# Patient Record
Sex: Male | Born: 2000 | Race: Black or African American | Hispanic: No | Marital: Single | Smoking: Current every day smoker
Health system: Southern US, Community
[De-identification: ages and names within clinical notes are randomized; demographics above are authoritative.]

## PROBLEM LIST (undated history)

## (undated) DIAGNOSIS — F988 Other specified behavioral and emotional disorders with onset usually occurring in childhood and adolescence: Secondary | ICD-10-CM

---
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
## 2018-10-30 ENCOUNTER — Emergency Department (HOSPITAL_COMMUNITY)
Admission: EM | Admit: 2018-10-30 | Discharge: 2018-10-31 | Disposition: A | Discharge status: Home / Self Care | Payer: 59 | Attending: Emergency Medicine | Admitting: Emergency Medicine

## 2018-10-30 ENCOUNTER — Encounter (HOSPITAL_COMMUNITY): Payer: Self-pay | Admitting: *Deleted

## 2018-10-30 ENCOUNTER — Other Ambulatory Visit: Payer: Self-pay

## 2018-10-30 DIAGNOSIS — Z202 Contact with and (suspected) exposure to infections with a predominantly sexual mode of transmission: Secondary | ICD-10-CM | POA: Insufficient documentation

## 2018-10-30 DIAGNOSIS — F909 Attention-deficit hyperactivity disorder, unspecified type: Secondary | ICD-10-CM | POA: Insufficient documentation

## 2018-10-30 DIAGNOSIS — Z79899 Other long term (current) drug therapy: Secondary | ICD-10-CM | POA: Diagnosis not present

## 2018-10-30 LAB — URINALYSIS, ROUTINE W REFLEX MICROSCOPIC
BILIRUBIN URINE: NEGATIVE
Bacteria, UA: NONE SEEN
Glucose, UA: NEGATIVE mg/dL
Hgb urine dipstick: NEGATIVE
Ketones, ur: NEGATIVE mg/dL
Nitrite: NEGATIVE
Protein, ur: NEGATIVE mg/dL
SPECIFIC GRAVITY, URINE: 1.027 (ref 1.005–1.030)
WBC, UA: >50 WBC/hpf — ABNORMAL HIGH (ref 0–5)
pH: 6 (ref 5.0–8.0)

## 2018-10-30 MED ORDER — LIDOCAINE HCL (PF) 1 % IJ SOLN
INTRAMUSCULAR | Status: AC
  Filled 2018-10-30: qty 5

## 2018-10-30 MED ORDER — CEFTRIAXONE SODIUM 250 MG IJ SOLR
250.0000 mg | Freq: Once | INTRAMUSCULAR | Status: AC
  Administered 2018-10-30: 250 mg via INTRAMUSCULAR
  Filled 2018-10-30: qty 250

## 2018-10-30 MED ORDER — AZITHROMYCIN 250 MG PO TABS
1000.0000 mg | ORAL_TABLET | Freq: Once | ORAL | Status: AC
  Administered 2018-10-30: 1000 mg via ORAL
  Filled 2018-10-30: qty 4

## 2018-10-30 NOTE — ED Provider Notes (Addendum)
[facility] EMERGENCY DEPARTMENT Provider Note   CSN: [id number] Arrival date & time: 10/30/18  2226     History   Chief Complaint Chief Complaint  Patient presents with  . Exposure to STD    HPI Jim Myers is a 17 y.o. male presenting for evaluation treatment of STDs.  Patient states he was told by his partner that she was positive for gonorrhea and chlamydia today.  He is here for treatment.  Patient states that last week he was having intermittent dysuria.  This has completely resolved.  He is having no further pain.  He denies penile discharge.  He denies fevers, chills, nausea, vomiting, abdominal pain.  He has no medical problems, takes no medications daily.  He is sexually active with 2 male partners.    HPI  Past Medical History:  Diagnosis Date  . Attention deficit disorder     There are no active problems to display for this patient.   History reviewed. No pertinent surgical history.      Home Medications    Prior to Admission medications   Medication Sig Start Date End Date Taking? Authorizing Provider  azithromycin (ZITHROMAX) 500 MG tablet Take 2 tablets (1,000 mg total) by mouth daily. 07/20/14   Ozella Rocks, MD  Methylphenidate HCl (CONCERTA PO) Take by mouth.    [provider]    Family History No family history on file.  Social History Social History   Tobacco Use  . Smoking status: Never Smoker  . Smokeless tobacco: Never Used  Substance Use Topics  . Alcohol use: No  . Drug use: Yes    Types: Marijuana     Allergies   Patient has no known allergies.   Review of Systems Review of Systems  Constitutional: Negative for fever.  Genitourinary: Positive for dysuria (resolved).     Physical Exam Updated Vital Signs BP 124/79 (BP Location: Right Arm)   Pulse 65   Temp 98.4 F (36.9 C) (Oral)   Resp 14   Ht 6' (1.829 m)   Wt 68 kg   SpO2 100%   BMI 20.34 kg/m   Physical Exam    Constitutional: He is oriented to person, place, and time. He appears well-developed and well-nourished. No distress.  HENT:  Head: Normocephalic and atraumatic.  Eyes: EOM are normal.  Neck: Normal range of motion.  Cardiovascular: Normal rate, regular rhythm and intact distal pulses.  Pulmonary/Chest: Effort normal and breath sounds normal. No respiratory distress. He has no wheezes.  Abdominal: Soft. He exhibits no distension and no mass. There is no tenderness. There is no guarding.  Genitourinary: Testes normal and penis normal. Circumcised. No discharge found.  Genitourinary Comments: Chaperone present.  No inguinal lymphadenopathy.  No penile tenderness.  No penile discharge.  No testicular pain or swelling.  Musculoskeletal: Normal range of motion.  Lymphadenopathy: No inguinal adenopathy noted on the right or left side.  Neurological: He is alert and oriented to person, place, and time.  Skin: Skin is warm. Capillary refill takes less than 2 seconds. No rash noted.  Psychiatric: He has a normal mood and affect.  Nursing note and vitals reviewed.    ED Treatments / Results  Labs (all labs ordered are listed, but only abnormal results are displayed) Labs Reviewed  URINALYSIS, ROUTINE W REFLEX MICROSCOPIC - Abnormal; Notable for the following components:      Result Value   APPearance HAZY (*)    Leukocytes, UA LARGE (*)  WBC, UA >50 (*)    All other components within normal limits  RPR  HIV ANTIBODY (ROUTINE TESTING W REFLEX)  GC/CHLAMYDIA PROBE AMP ([place]) NOT AT [facility]    EKG None  Radiology No results found.  Procedures Procedures (including critical care time)  Medications Ordered in ED Medications  cefTRIAXone (ROCEPHIN) injection 250 mg (has no administration in time range)  azithromycin (ZITHROMAX) tablet 1,000 mg (has no administration in time range)     Initial Impression / Assessment and Plan / ED Course  I have reviewed the triage vital  signs and the nursing notes.  Pertinent labs & imaging results that were available during my care of the patient were reviewed by me and considered in my medical decision making (see chart for details).     Pt presents with concerns for possible STD.  Pt understands that they have GC/Chlamydia cultures pending and that they will need to inform all sexual partners if results return positive. Pt has been treated prophylactically with azithromycin and Rocephin due to pts h/o positive contact.  Patient to be discharged with instructions to follow up with PCP. Discussed importance of using protection when sexually active.  UA with large leuks, no nitrites.  No bacteria.  Patient without urinary symptoms at this time.  Per chart review, patient with similar UAs in the past.  I do not believe he needs antibiotic treatment for this at this time.  Patient to follow-up with his PCP as needed if urinary symptoms persist.  At this time, patient appears safe for discharge.  Return precautions given.  Patient states he understands and agrees plan.   Final Clinical Impressions(s) / ED Diagnoses   Final diagnoses:  Exposure to STD    ED Discharge Orders    None       Alveria Apley, PA-C 10/30/18 2355    Alveria Apley, PA-C 10/30/18 2356    Dione Booze, MD 10/31/18 [phone]

## 2018-10-30 NOTE — ED Triage Notes (Signed)
The pt is here for  A std check  He has had burning when he urinates.  He was just told he had been exposed to his sexual partner that was diagnosed with gc today

## 2018-10-30 NOTE — Discharge Instructions (Addendum)
You were treated for gonorrhea and chlamydia today. The lab test for gonorrhea, chlamydia, HIV, and syphilis are pending.  The results should return in about 3 days.  If the results are positive, you will receive a phone call.  If they are negative, you will not.  You may check either way online on MyChart. If results are positive for gonorrhea or chlamydia, you do not need further treatment. If results are positive for HIV or syphilis, you will need treatment at the health department or with your primary care doctor. Abstain from sex for the next 2 weeks, as this can cause reinfection. Follow-up with your primary care doctor if your urinary symptoms do not improve after treatment. Return to the emergency room with any new, worsening, or concerning symptoms.

## 2018-10-31 LAB — RPR: RPR Ser Ql: NONREACTIVE

## 2018-10-31 LAB — HIV ANTIBODY (ROUTINE TESTING W REFLEX): HIV Screen 4th Generation wRfx: NONREACTIVE

## 2018-11-01 LAB — GC/CHLAMYDIA PROBE AMP (~~LOC~~) NOT AT ARMC
Chlamydia: POSITIVE — AB
Neisseria Gonorrhea: POSITIVE — AB

## 2019-06-18 ENCOUNTER — Emergency Department (HOSPITAL_COMMUNITY)
Admission: EM | Admit: 2019-06-18 | Discharge: 2019-06-19 | Disposition: A | Discharge status: Home / Self Care | Payer: 59 | Attending: Emergency Medicine | Admitting: Emergency Medicine

## 2019-06-18 DIAGNOSIS — Z5321 Procedure and treatment not carried out due to patient leaving prior to being seen by health care provider: Secondary | ICD-10-CM | POA: Insufficient documentation

## 2019-06-18 DIAGNOSIS — R109 Unspecified abdominal pain: Secondary | ICD-10-CM | POA: Insufficient documentation

## 2019-06-18 NOTE — ED Triage Notes (Signed)
Patient here with abdominal pain that started this morning with diarrhea.  He states that he needs a work note also.  Patient states that he is fine, but just needs a work note.

## 2019-06-19 ENCOUNTER — Other Ambulatory Visit: Payer: Self-pay

## 2019-06-19 ENCOUNTER — Encounter (HOSPITAL_COMMUNITY): Payer: Self-pay | Admitting: Emergency Medicine

## 2019-06-19 NOTE — ED Notes (Addendum)
Pt reports he can't wait to be seen and he is leaving. Requested pt to stay and wait to be seen, pt decided to leave.

## 2019-07-31 ENCOUNTER — Encounter (HOSPITAL_COMMUNITY): Payer: Self-pay | Admitting: Emergency Medicine

## 2019-07-31 ENCOUNTER — Emergency Department (HOSPITAL_COMMUNITY)
Admission: EM | Admit: 2019-07-31 | Discharge: 2019-07-31 | Disposition: A | Discharge status: Home / Self Care | Payer: 59 | Attending: Emergency Medicine | Admitting: Emergency Medicine

## 2019-07-31 DIAGNOSIS — Z202 Contact with and (suspected) exposure to infections with a predominantly sexual mode of transmission: Secondary | ICD-10-CM | POA: Diagnosis present

## 2019-07-31 LAB — URINALYSIS, ROUTINE W REFLEX MICROSCOPIC
Bilirubin Urine: NEGATIVE
Glucose, UA: NEGATIVE mg/dL
Hgb urine dipstick: NEGATIVE
Ketones, ur: NEGATIVE mg/dL
Leukocytes,Ua: NEGATIVE
Nitrite: NEGATIVE
Protein, ur: NEGATIVE mg/dL
Specific Gravity, Urine: 1.017 (ref 1.005–1.030)
pH: 7 (ref 5.0–8.0)

## 2019-07-31 MED ORDER — CEFTRIAXONE SODIUM 250 MG IJ SOLR
250.0000 mg | Freq: Once | INTRAMUSCULAR | Status: AC
  Administered 2019-07-31: 250 mg via INTRAMUSCULAR
  Filled 2019-07-31: qty 250

## 2019-07-31 MED ORDER — STERILE WATER FOR INJECTION IJ SOLN
INTRAMUSCULAR | Status: AC
  Administered 2019-07-31: 0.9 mL
  Filled 2019-07-31: qty 10

## 2019-07-31 MED ORDER — AZITHROMYCIN 250 MG PO TABS
1000.0000 mg | ORAL_TABLET | Freq: Once | ORAL | Status: AC
  Administered 2019-07-31: 1000 mg via ORAL
  Filled 2019-07-31: qty 4

## 2019-07-31 NOTE — ED Provider Notes (Signed)
[facility] EMERGENCY DEPARTMENT Provider Note   CSN: [id number] Arrival date & time: 07/31/19  1114   History   Chief Complaint Chief Complaint  Patient presents with  . Exposure to STD    HPI Jim Myers is a 18 y.o. male with past medical history significant for gonorrhea, chlamydia presents for evaluation of STD screening.  Patient states his girlfriend recently tested positive for chlamydia.  He denies fever, chills, nausea, vomiting, abdominal pain, pain with bowel movements, penile discharge, penile rashes or lesions, inguinal swelling, testicular pain or swelling, no joint pain or joint swelling..  Requesting HIV and syphilis testing in addition.  States he is sexually active in a monogamous relationship.  Has had multiple sexual partners.  Intermittently uses protection.  Not followed by PCP.  History obtained from patient and past medical records.  No interpreter was used.     HPI  Past Medical History:  Diagnosis Date  . Attention deficit disorder     There are no active problems to display for this patient.   History reviewed. No pertinent surgical history.      Home Medications    Prior to Admission medications   Medication Sig Start Date End Date Taking? Authorizing Provider  azithromycin (ZITHROMAX) 500 MG tablet Take 2 tablets (1,000 mg total) by mouth daily. 07/20/14   Waldemar Dickens, MD  Methylphenidate HCl (CONCERTA PO) Take by mouth.    [provider]    Family History No family history on file.  Social History Social History   Tobacco Use  . Smoking status: Never Smoker  . Smokeless tobacco: Never Used  Substance Use Topics  . Alcohol use: No  . Drug use: Yes    Types: Marijuana     Allergies   Patient has no known allergies.   Review of Systems Review of Systems  Constitutional: Negative.   HENT: Negative.   Respiratory: Negative.   Cardiovascular: Negative.   Gastrointestinal: Negative.    Genitourinary: Negative.   Musculoskeletal: Negative.   Skin: Negative.   Neurological: Negative.   All other systems reviewed and are negative.    Physical Exam Updated Vital Signs BP 126/79 (BP Location: Right Arm)   Pulse 81   Temp 97.7 F (36.5 C) (Oral)   Resp 20   SpO2 98%   Physical Exam Vitals signs and nursing note reviewed. Exam conducted with a chaperone present.  Constitutional:      General: He is not in acute distress.    Appearance: He is well-developed. He is not ill-appearing, toxic-appearing or diaphoretic.  HENT:     Head: Normocephalic and atraumatic.     Nose: Nose normal.     Mouth/Throat:     Mouth: Mucous membranes are moist.     Pharynx: Oropharynx is clear.  Eyes:     Pupils: Pupils are equal, round, and reactive to light.  Neck:     Musculoskeletal: Normal range of motion and neck supple.  Cardiovascular:     Rate and Rhythm: Normal rate and regular rhythm.  Pulmonary:     Effort: Pulmonary effort is normal. No respiratory distress.  Abdominal:     General: There is no distension.     Palpations: Abdomen is soft.     Hernia: There is no hernia in the left inguinal area or right inguinal area.  Genitourinary:    Penis: Normal.      Scrotum/Testes: Normal. Cremasteric reflex is present.     Epididymis:  Right: Normal.     Left: Normal.     Comments: GU exam performed by PA student Osten with chaperone in room.  Mild thin, clear discharge at urethral meatus.  No testicular tenderness.  No tenderness over epididymis. Musculoskeletal: Normal range of motion.  Lymphadenopathy:     Lower Body: No right inguinal adenopathy. No left inguinal adenopathy.  Skin:    General: Skin is warm and dry.  Neurological:     Mental Status: He is alert.      ED Treatments / Results  Labs (all labs ordered are listed, but only abnormal results are displayed) Labs Reviewed  URINALYSIS, ROUTINE W REFLEX MICROSCOPIC  HIV ANTIBODY (ROUTINE TESTING W  REFLEX)  RPR  GC/CHLAMYDIA PROBE AMP ([place]    EKG None  Radiology No results found.  Procedures Procedures (including critical care time)  Medications Ordered in ED Medications  cefTRIAXone (ROCEPHIN) injection 250 mg (has no administration in time range)  azithromycin (ZITHROMAX) tablet 1,000 mg (has no administration in time range)     Initial Impression / Assessment and Plan / ED Course  I have reviewed the triage vital signs and the nursing notes.  Pertinent labs & imaging results that were available during my care of the patient were reviewed by me and considered in my medical decision making (see chart for details).  18 year old male appears otherwise well presents for evaluation of exposure to STD.  Denies any current symptoms.  Girlfriend tested positive for gonorrhea.   Patient is afebrile without abdominal tenderness, abdominal pain or painful bowel movements to indicate prostatitis.  No tenderness to palpation of the testes or epididymis to suggest orchitis or epididymitis.  STD cultures obtained including HIV, syphilis, gonorrhea and chlamydia. Patient to be discharged with instructions to follow up with PCP. Discussed importance of using protection when sexually active. Pt understands that they have GC/Chlamydia cultures pending and that they will need to inform all sexual partners if results return positive. Patient has been treated prophylactically with azithromycin and Rocephin.       Final Clinical Impressions(s) / ED Diagnoses   Final diagnoses:  STD exposure    ED Discharge Orders    None       Cable Fearn A, PA-C 07/31/19 1204    Sabas SousBero, Michael M, MD 08/04/19 [phone]

## 2019-07-31 NOTE — Discharge Instructions (Signed)
Your STD results will be called to you in 3 days if positive.  If your HIV or syphilis testing is positive you will need to seek reevaluation with the health department.  You are given antibiotics for gonorrhea and chlamydia.  If these results are positive you do not need to seek treatment.  You will however need to notify anyone you have had a recent sexual contact with.  Return to the ED for any new worsening symptoms.
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
## 2019-08-01 LAB — HIV ANTIBODY (ROUTINE TESTING W REFLEX): HIV Screen 4th Generation wRfx: NONREACTIVE

## 2019-08-02 LAB — RPR: RPR Ser Ql: NONREACTIVE

## 2019-11-17 ENCOUNTER — Other Ambulatory Visit: Payer: Self-pay

## 2019-11-17 DIAGNOSIS — Z20822 Contact with and (suspected) exposure to covid-19: Secondary | ICD-10-CM

## 2019-11-20 LAB — NOVEL CORONAVIRUS, NAA: SARS-CoV-2, NAA: NOT DETECTED

## 2019-12-24 ENCOUNTER — Emergency Department (HOSPITAL_COMMUNITY): Payer: 59

## 2019-12-24 ENCOUNTER — Encounter (HOSPITAL_COMMUNITY): Payer: Self-pay | Admitting: Emergency Medicine

## 2019-12-24 ENCOUNTER — Emergency Department (HOSPITAL_COMMUNITY)
Admission: EM | Admit: 2019-12-24 | Discharge: 2019-12-25 | Disposition: A | Discharge status: Home / Self Care | Payer: 59 | Attending: Emergency Medicine | Admitting: Emergency Medicine

## 2019-12-24 ENCOUNTER — Other Ambulatory Visit: Payer: Self-pay

## 2019-12-24 DIAGNOSIS — S79922A Unspecified injury of left thigh, initial encounter: Secondary | ICD-10-CM | POA: Diagnosis not present

## 2019-12-24 DIAGNOSIS — Y92008 Other place in unspecified non-institutional (private) residence as the place of occurrence of the external cause: Secondary | ICD-10-CM | POA: Diagnosis not present

## 2019-12-24 DIAGNOSIS — W3400XA Accidental discharge from unspecified firearms or gun, initial encounter: Secondary | ICD-10-CM | POA: Diagnosis not present

## 2019-12-24 DIAGNOSIS — Z23 Encounter for immunization: Secondary | ICD-10-CM | POA: Diagnosis not present

## 2019-12-24 DIAGNOSIS — Y939 Activity, unspecified: Secondary | ICD-10-CM | POA: Insufficient documentation

## 2019-12-24 DIAGNOSIS — Y999 Unspecified external cause status: Secondary | ICD-10-CM | POA: Diagnosis not present

## 2019-12-24 DIAGNOSIS — F17200 Nicotine dependence, unspecified, uncomplicated: Secondary | ICD-10-CM | POA: Insufficient documentation

## 2019-12-24 LAB — ETHANOL: Alcohol, Ethyl (B): 88 mg/dL — ABNORMAL HIGH (ref <10)

## 2019-12-24 LAB — COMPREHENSIVE METABOLIC PANEL
ALT: 27 U/L (ref 0–44)
AST: 43 U/L — ABNORMAL HIGH (ref 15–41)
Albumin: 4.9 g/dL (ref 3.5–5.0)
Alkaline Phosphatase: 75 U/L (ref 38–126)
Anion gap: 12 (ref 5–15)
BUN: 8 mg/dL (ref 6–20)
CO2: 26 mmol/L (ref 22–32)
Calcium: 9.7 mg/dL (ref 8.9–10.3)
Chloride: 103 mmol/L (ref 98–111)
Creatinine, Ser: 1.19 mg/dL (ref 0.61–1.24)
GFR calc Af Amer: >60 mL/min (ref >60)
GFR calc non Af Amer: >60 mL/min (ref >60)
Glucose, Bld: 126 mg/dL — ABNORMAL HIGH (ref 70–99)
Potassium: 5.1 mmol/L (ref 3.5–5.1)
Sodium: 141 mmol/L (ref 135–145)
Total Bilirubin: 1.2 mg/dL (ref 0.3–1.2)
Total Protein: 7.7 g/dL (ref 6.5–8.1)

## 2019-12-24 LAB — CBC
HCT: 47.3 % (ref 39.0–52.0)
Hemoglobin: 15.8 g/dL (ref 13.0–17.0)
MCH: 31.6 pg (ref 26.0–34.0)
MCHC: 33.4 g/dL (ref 30.0–36.0)
MCV: 94.6 fL (ref 80.0–100.0)
Platelets: 264 10*3/uL (ref 150–400)
RBC: 5 MIL/uL (ref 4.22–5.81)
RDW: 12.2 % (ref 11.5–15.5)
WBC: 6.6 10*3/uL (ref 4.0–10.5)
nRBC: 0 % (ref 0.0–0.2)

## 2019-12-24 LAB — PROTIME-INR
INR: 0.9 (ref 0.8–1.2)
Prothrombin Time: 12.2 seconds (ref 11.4–15.2)

## 2019-12-24 LAB — SAMPLE TO BLOOD BANK

## 2019-12-24 MED ORDER — CEFAZOLIN SODIUM-DEXTROSE 2-4 GM/100ML-% IV SOLN
2.0000 g | Freq: Once | INTRAVENOUS | Status: AC
  Administered 2019-12-24: 2 g via INTRAVENOUS
  Filled 2019-12-24: qty 100

## 2019-12-24 MED ORDER — TETANUS-DIPHTH-ACELL PERTUSSIS 5-2.5-18.5 LF-MCG/0.5 IM SUSP
0.5000 mL | Freq: Once | INTRAMUSCULAR | Status: AC
  Administered 2019-12-24: 0.5 mL via INTRAMUSCULAR
  Filled 2019-12-24: qty 0.5
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
## 2019-12-25 LAB — CDS SEROLOGY

## 2019-12-25 MED ORDER — CEPHALEXIN 500 MG PO CAPS: 500.0000 mg | ORAL_CAPSULE | Freq: Four times a day (QID) | ORAL | 0 refills | Status: DC

## 2019-12-25 MED ORDER — OXYCODONE HCL 5 MG PO TABS
10.0000 mg | ORAL_TABLET | Freq: Once | ORAL | Status: AC
  Administered 2019-12-25: 01:00:00 10 mg via ORAL
  Filled 2019-12-25: qty 2

## 2019-12-25 MED ORDER — OXYCODONE-ACETAMINOPHEN 5-325 MG PO TABS: 1.0000 | ORAL_TABLET | Freq: Once | ORAL | Status: DC
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
## 2019-12-25 NOTE — ED Notes (Signed)
[employer] at bedside 

## 2019-12-26 ENCOUNTER — Encounter (HOSPITAL_COMMUNITY): Payer: Self-pay | Admitting: Emergency Medicine

## 2020-06-04 ENCOUNTER — Other Ambulatory Visit: Payer: Self-pay

## 2020-06-04 ENCOUNTER — Encounter (HOSPITAL_BASED_OUTPATIENT_CLINIC_OR_DEPARTMENT_OTHER): Payer: Self-pay | Admitting: Surgery

## 2020-06-08 ENCOUNTER — Other Ambulatory Visit (HOSPITAL_COMMUNITY)
Admission: RE | Admit: 2020-06-08 | Discharge: 2020-06-08 | Disposition: A | Discharge status: Home / Self Care | Payer: 59 | Source: Ambulatory Visit | Attending: Surgery | Admitting: Surgery

## 2020-06-08 ENCOUNTER — Ambulatory Visit: Payer: Self-pay | Admitting: Surgery

## 2020-06-08 DIAGNOSIS — Z01812 Encounter for preprocedural laboratory examination: Secondary | ICD-10-CM | POA: Diagnosis not present

## 2020-06-08 DIAGNOSIS — Z20822 Contact with and (suspected) exposure to covid-19: Secondary | ICD-10-CM | POA: Diagnosis not present

## 2020-06-08 LAB — SARS CORONAVIRUS 2 (TAT 6-24 HRS): SARS Coronavirus 2: NEGATIVE

## 2020-06-12 ENCOUNTER — Encounter (HOSPITAL_BASED_OUTPATIENT_CLINIC_OR_DEPARTMENT_OTHER): Payer: Self-pay | Admitting: Surgery

## 2020-06-12 ENCOUNTER — Ambulatory Visit (HOSPITAL_BASED_OUTPATIENT_CLINIC_OR_DEPARTMENT_OTHER): Payer: 59 | Admitting: Anesthesiology

## 2020-06-12 ENCOUNTER — Other Ambulatory Visit: Payer: Self-pay

## 2020-06-12 ENCOUNTER — Encounter (HOSPITAL_BASED_OUTPATIENT_CLINIC_OR_DEPARTMENT_OTHER)
Admission: RE | Disposition: A | Discharge status: Home / Self Care | Payer: Self-pay | Source: Home / Self Care | Attending: Surgery

## 2020-06-12 ENCOUNTER — Ambulatory Visit (HOSPITAL_BASED_OUTPATIENT_CLINIC_OR_DEPARTMENT_OTHER)
Admission: RE | Admit: 2020-06-12 | Discharge: 2020-06-12 | Disposition: A | Discharge status: Home / Self Care | Payer: 59 | Attending: Surgery | Admitting: Surgery

## 2020-06-12 DIAGNOSIS — F17210 Nicotine dependence, cigarettes, uncomplicated: Secondary | ICD-10-CM | POA: Diagnosis not present

## 2020-06-12 DIAGNOSIS — S71142A Puncture wound with foreign body, left thigh, initial encounter: Secondary | ICD-10-CM | POA: Diagnosis present

## 2020-06-12 DIAGNOSIS — W3400XA Accidental discharge from unspecified firearms or gun, initial encounter: Secondary | ICD-10-CM | POA: Diagnosis not present

## 2020-06-12 HISTORY — PX: FOREIGN BODY REMOVAL: SHX962

## 2020-06-12 SURGERY — FOREIGN BODY REMOVAL ADULT
Anesthesia: General | Site: Thigh | Laterality: Left

## 2020-06-12 MED ORDER — MEPERIDINE HCL 25 MG/ML IJ SOLN: 6.2500 mg | INTRAMUSCULAR | Status: DC | PRN

## 2020-06-12 MED ORDER — OXYCODONE HCL 5 MG/5ML PO SOLN: 5.0000 mg | Freq: Once | ORAL | Status: AC | PRN

## 2020-06-12 MED ORDER — MIDAZOLAM HCL 2 MG/2ML IJ SOLN
INTRAMUSCULAR | Status: AC
  Filled 2020-06-12: qty 2

## 2020-06-12 MED ORDER — LIDOCAINE 2% (20 MG/ML) 5 ML SYRINGE
INTRAMUSCULAR | Status: AC
  Filled 2020-06-12: qty 5

## 2020-06-12 MED ORDER — BUPIVACAINE HCL (PF) 0.5 % IJ SOLN
INTRAMUSCULAR | Status: DC | PRN
  Administered 2020-06-12: 10 mL

## 2020-06-12 MED ORDER — FENTANYL CITRATE (PF) 100 MCG/2ML IJ SOLN
INTRAMUSCULAR | Status: AC
  Filled 2020-06-12: qty 2

## 2020-06-12 MED ORDER — IBUPROFEN 800 MG PO TABS
800.0000 mg | ORAL_TABLET | Freq: Three times a day (TID) | ORAL | 1 refills | Status: AC
Start: 2020-06-12 — End: ?

## 2020-06-12 MED ORDER — ONDANSETRON HCL 4 MG/2ML IJ SOLN
INTRAMUSCULAR | Status: DC | PRN
  Administered 2020-06-12: 4 mg via INTRAVENOUS

## 2020-06-12 MED ORDER — LACTATED RINGERS IV SOLN: INTRAVENOUS | Status: DC

## 2020-06-12 MED ORDER — PROPOFOL 10 MG/ML IV BOLUS
INTRAVENOUS | Status: DC | PRN
  Administered 2020-06-12: 200 mg via INTRAVENOUS

## 2020-06-12 MED ORDER — MIDAZOLAM HCL 5 MG/5ML IJ SOLN
INTRAMUSCULAR | Status: DC | PRN
  Administered 2020-06-12: 2 mg via INTRAVENOUS

## 2020-06-12 MED ORDER — METHOCARBAMOL 500 MG PO TABS: 500.0000 mg | ORAL_TABLET | Freq: Four times a day (QID) | ORAL | 1 refills | Status: AC

## 2020-06-12 MED ORDER — DEXAMETHASONE SODIUM PHOSPHATE 4 MG/ML IJ SOLN
INTRAMUSCULAR | Status: DC | PRN
  Administered 2020-06-12: 5 mg via INTRAVENOUS

## 2020-06-12 MED ORDER — OXYCODONE HCL 5 MG PO TABS
5.0000 mg | ORAL_TABLET | Freq: Once | ORAL | Status: AC | PRN
  Administered 2020-06-12: 5 mg via ORAL

## 2020-06-12 MED ORDER — PROMETHAZINE HCL 25 MG/ML IJ SOLN: 6.2500 mg | INTRAMUSCULAR | Status: DC | PRN

## 2020-06-12 MED ORDER — BUPIVACAINE LIPOSOME 1.3 % IJ SUSP: 20.0000 mL | Freq: Once | INTRAMUSCULAR | Status: DC

## 2020-06-12 MED ORDER — CHLORHEXIDINE GLUCONATE CLOTH 2 % EX PADS: 6.0000 | MEDICATED_PAD | Freq: Once | CUTANEOUS | Status: DC

## 2020-06-12 MED ORDER — LIDOCAINE 2% (20 MG/ML) 5 ML SYRINGE
INTRAMUSCULAR | Status: DC | PRN
  Administered 2020-06-12: 60 mg via INTRAVENOUS

## 2020-06-12 MED ORDER — BUPIVACAINE HCL (PF) 0.5 % IJ SOLN
INTRAMUSCULAR | Status: AC
  Filled 2020-06-12: qty 30

## 2020-06-12 MED ORDER — LIDOCAINE HCL (PF) 1 % IJ SOLN
INTRAMUSCULAR | Status: AC
  Filled 2020-06-12: qty 30

## 2020-06-12 MED ORDER — PROPOFOL 10 MG/ML IV BOLUS
INTRAVENOUS | Status: AC
  Filled 2020-06-12: qty 20

## 2020-06-12 MED ORDER — CEFAZOLIN SODIUM-DEXTROSE 2-4 GM/100ML-% IV SOLN
2.0000 g | INTRAVENOUS | Status: AC
  Administered 2020-06-12: 2 g via INTRAVENOUS

## 2020-06-12 MED ORDER — HYDROMORPHONE HCL 1 MG/ML IJ SOLN: 0.2500 mg | INTRAMUSCULAR | Status: DC | PRN

## 2020-06-12 MED ORDER — FENTANYL CITRATE (PF) 100 MCG/2ML IJ SOLN
INTRAMUSCULAR | Status: DC | PRN
  Administered 2020-06-12: 100 ug via INTRAVENOUS

## 2020-06-12 MED ORDER — 0.9 % SODIUM CHLORIDE (POUR BTL) OPTIME
TOPICAL | Status: DC | PRN
  Administered 2020-06-12: 1000 mL

## 2020-06-12 MED ORDER — OXYCODONE HCL 5 MG PO TABS
ORAL_TABLET | ORAL | Status: AC
  Filled 2020-06-12: qty 1

## 2020-06-12 MED ORDER — CEFAZOLIN SODIUM-DEXTROSE 2-4 GM/100ML-% IV SOLN
INTRAVENOUS | Status: AC
  Filled 2020-06-12: qty 100

## 2020-06-12 SURGICAL SUPPLY — 63 items
BENZOIN TINCTURE PRP APPL 2/3 (GAUZE/BANDAGES/DRESSINGS) ×3 IMPLANT
BLADE CLIPPER SURG (BLADE) IMPLANT
BLADE SURG 10 STRL SS (BLADE) ×3 IMPLANT
BLADE SURG 15 STRL LF DISP TIS (BLADE) ×1 IMPLANT
BLADE SURG 15 STRL SS (BLADE) ×2
CANISTER SUCT 1200ML W/VALVE (MISCELLANEOUS) ×3 IMPLANT
CHLORAPREP W/TINT 26 (MISCELLANEOUS) ×3 IMPLANT
CLOSURE STERI-STRIP 1/2X4 (GAUZE/BANDAGES/DRESSINGS) ×1
CLOSURE WOUND 1/4X4 (GAUZE/BANDAGES/DRESSINGS) ×1
CLSR STERI-STRIP ANTIMIC 1/2X4 (GAUZE/BANDAGES/DRESSINGS) ×2 IMPLANT
COVER BACK TABLE 60X90IN (DRAPES) ×3 IMPLANT
COVER MAYO STAND STRL (DRAPES) ×6 IMPLANT
COVER PROBE 5X48 (MISCELLANEOUS) ×2
COVER WAND RF STERILE (DRAPES) IMPLANT
DECANTER SPIKE VIAL GLASS SM (MISCELLANEOUS) IMPLANT
DERMABOND ADVANCED (GAUZE/BANDAGES/DRESSINGS) ×2
DERMABOND ADVANCED .7 DNX12 (GAUZE/BANDAGES/DRESSINGS) ×1 IMPLANT
DRAPE LAPAROTOMY 100X72 PEDS (DRAPES) ×3 IMPLANT
DRAPE UTILITY XL STRL (DRAPES) ×3 IMPLANT
DRSG TEGADERM 4X10 (GAUZE/BANDAGES/DRESSINGS) ×3 IMPLANT
DRSG TEGADERM 4X4.75 (GAUZE/BANDAGES/DRESSINGS) IMPLANT
DRSG TELFA 3X8 NADH (GAUZE/BANDAGES/DRESSINGS) ×3 IMPLANT
ELECT COATED BLADE 2.86 ST (ELECTRODE) IMPLANT
ELECT REM PT RETURN 9FT ADLT (ELECTROSURGICAL) ×3
ELECTRODE REM PT RTRN 9FT ADLT (ELECTROSURGICAL) ×1 IMPLANT
GAUZE PACKING IODOFORM 1/4X15 (PACKING) IMPLANT
GAUZE SPONGE 4X4 12PLY STRL LF (GAUZE/BANDAGES/DRESSINGS) IMPLANT
GLOVE BIO SURGEON STRL SZ 6.5 (GLOVE) ×2 IMPLANT
GLOVE BIO SURGEONS STRL SZ 6.5 (GLOVE) ×1
GLOVE BIOGEL M 6.5 STRL (GLOVE) ×3 IMPLANT
GLOVE BIOGEL M 7.0 STRL (GLOVE) ×3 IMPLANT
GLOVE BIOGEL PI IND STRL 6 (GLOVE) ×1 IMPLANT
GLOVE BIOGEL PI IND STRL 7.0 (GLOVE) ×2 IMPLANT
GLOVE BIOGEL PI INDICATOR 6 (GLOVE) ×2
GLOVE BIOGEL PI INDICATOR 7.0 (GLOVE) ×4
GOWN STRL REUS W/ TWL LRG LVL3 (GOWN DISPOSABLE) ×2 IMPLANT
GOWN STRL REUS W/TWL LRG LVL3 (GOWN DISPOSABLE) ×4
KIT CVR 48X5XPRB PLUP LF (MISCELLANEOUS) ×1 IMPLANT
KIT MARKER MARGIN INK (KITS) IMPLANT
NEEDLE HYPO 25X1 1.5 SAFETY (NEEDLE) ×3 IMPLANT
NS IRRIG 1000ML POUR BTL (IV SOLUTION) IMPLANT
PENCIL SMOKE EVACUATOR (MISCELLANEOUS) ×3 IMPLANT
SET BASIN DAY SURGERY F.S. (CUSTOM PROCEDURE TRAY) ×3 IMPLANT
SLEEVE SCD COMPRESS KNEE MED (MISCELLANEOUS) ×3 IMPLANT
SPONGE LAP 18X18 RF (DISPOSABLE) ×3 IMPLANT
STRIP CLOSURE SKIN 1/4X4 (GAUZE/BANDAGES/DRESSINGS) ×2 IMPLANT
SUT ETHILON 2 0 FS 18 (SUTURE) IMPLANT
SUT MNCRL AB 4-0 PS2 18 (SUTURE) ×3 IMPLANT
SUT SILK 2 0 SH (SUTURE) IMPLANT
SUT VIC AB 2-0 SH 27 (SUTURE)
SUT VIC AB 2-0 SH 27XBRD (SUTURE) IMPLANT
SUT VIC AB 3-0 SH 27 (SUTURE) ×2
SUT VIC AB 3-0 SH 27X BRD (SUTURE) ×1 IMPLANT
SUT VICRYL 3-0 CR8 SH (SUTURE) IMPLANT
SWAB COLLECTION DEVICE MRSA (MISCELLANEOUS) IMPLANT
SWAB CULTURE ESWAB REG 1ML (MISCELLANEOUS) IMPLANT
SYR BULB IRRIG 60ML STRL (SYRINGE) ×3 IMPLANT
SYR CONTROL 10ML LL (SYRINGE) ×3 IMPLANT
TOWEL GREEN STERILE FF (TOWEL DISPOSABLE) ×3 IMPLANT
TUBE CONNECTING 20'X1/4 (TUBING) ×1
TUBE CONNECTING 20X1/4 (TUBING) ×2 IMPLANT
UNDERPAD 30X36 HEAVY ABSORB (UNDERPADS AND DIAPERS) ×3 IMPLANT
YANKAUER SUCT BULB TIP NO VENT (SUCTIONS) ×3 IMPLANT
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
## 2020-06-12 NOTE — Anesthesia Preprocedure Evaluation (Signed)
Anesthesia Evaluation  Patient identified by MRN, date of birth, ID band Patient awake    Reviewed: Allergy & Precautions, NPO status , Patient's Chart, lab work & pertinent test results  Airway Mallampati: II  TM Distance: >3 FB Neck ROM: Full    Dental no notable dental hx.    Pulmonary neg pulmonary ROS, Current Smoker and Patient abstained from smoking.,    Pulmonary exam normal breath sounds clear to auscultation       Cardiovascular negative cardio ROS Normal cardiovascular exam Rhythm:Regular Rate:Normal     Neuro/Psych negative neurological ROS  negative psych ROS   GI/Hepatic negative GI ROS, Neg liver ROS,   Endo/Other  negative endocrine ROS  Renal/GU negative Renal ROS  negative genitourinary   Musculoskeletal negative musculoskeletal ROS (+)   Abdominal   Peds negative pediatric ROS (+)  Hematology negative hematology ROS (+)   Anesthesia Other Findings   Reproductive/Obstetrics negative OB ROS                             Anesthesia Physical Anesthesia Plan  ASA: II  Anesthesia Plan: General   Post-op Pain Management:    Induction: Intravenous  PONV Risk Score and Plan: 1 and Ondansetron and Treatment may vary due to age or medical condition  Airway Management Planned: LMA  Additional Equipment:   Intra-op Plan:   Post-operative Plan: Extubation in OR  Informed Consent: I have reviewed the patients History and Physical, chart, labs and discussed the procedure including the risks, benefits and alternatives for the proposed anesthesia with the patient or authorized representative who has indicated his/her understanding and acceptance.     Dental advisory given  Plan Discussed with: CRNA  Anesthesia Plan Comments:         Anesthesia Quick Evaluation

## 2020-06-12 NOTE — Anesthesia Procedure Notes (Signed)
Procedure Name: LMA Insertion Date/Time: 06/12/2020 10:55 AM Performed by: Burna Cash, CRNA Pre-anesthesia Checklist: Patient identified, Emergency Drugs available, Suction available and Patient being monitored Patient Re-evaluated:Patient Re-evaluated prior to induction Oxygen Delivery Method: Circle system utilized Preoxygenation: Pre-oxygenation with 100% oxygen Induction Type: IV induction Ventilation: Mask ventilation without difficulty LMA: LMA inserted LMA Size: 5.0 Number of attempts: 1 Airway Equipment and Method: Bite block Placement Confirmation: positive ETCO2 Tube secured with: Tape Dental Injury: Teeth and Oropharynx as per pre-operative assessment
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
## 2020-06-12 NOTE — Discharge Instructions (Addendum)
May shower beginning 06/13/2020. May remove clear dressing and white square underneath on 06/14/2020. Do not peel off strips underneath, allow them to fall off on their own. May allow warm soapy water to run over incision, then rinse and pat dry. Do not soak in any water (tubs, hot tubs, pools, lakes, oceans) for one week.   Call the office at [phone] for temperature greater than 101.28F, worsening pain, redness or warmth at the incision site.  Please call [phone] to make an appointment for 1-2 weeks after surgery for wound check.    Post Anesthesia Home Care Instructions  Activity: Get plenty of rest for the remainder of the day. A responsible individual must stay with you for 24 hours following the procedure.  For the next 24 hours, DO NOT: -Drive a car -Advertising copywriter -Drink alcoholic beverages -Take any medication unless instructed by your physician -Make any legal decisions or sign important papers.  Meals: Start with liquid foods such as gelatin or soup. Progress to regular foods as tolerated. Avoid greasy, spicy, heavy foods. If nausea and/or vomiting occur, drink only clear liquids until the nausea and/or vomiting subsides. Call your physician if vomiting continues.  Special Instructions/Symptoms: Your throat may feel dry or sore from the anesthesia or the breathing tube placed in your throat during surgery. If this causes discomfort, gargle with warm salt water. The discomfort should disappear within 24 hours.  If you had a scopolamine patch placed behind your ear for the management of post- operative nausea and/or vomiting:  1. The medication in the patch is effective for 72 hours, after which it should be removed.  Wrap patch in a tissue and discard in the trash. Wash hands thoroughly with soap and water. 2. You may remove the patch earlier than 72 hours if you experience unpleasant side effects which may include dry mouth, dizziness or visual disturbances. 3. Avoid  touching the patch. Wash your hands with soap and water after contact with the patch.
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
## 2020-06-13 ENCOUNTER — Encounter (HOSPITAL_BASED_OUTPATIENT_CLINIC_OR_DEPARTMENT_OTHER): Payer: Self-pay | Admitting: Surgery

## 2021-01-27 ENCOUNTER — Other Ambulatory Visit: Payer: Self-pay

## 2021-01-27 ENCOUNTER — Emergency Department (HOSPITAL_COMMUNITY): Payer: 59

## 2021-01-27 ENCOUNTER — Emergency Department (HOSPITAL_COMMUNITY)
Admission: EM | Admit: 2021-01-27 | Discharge: 2021-01-27 | Disposition: A | Discharge status: Home / Self Care | Payer: 59 | Attending: Emergency Medicine | Admitting: Emergency Medicine

## 2021-01-27 ENCOUNTER — Encounter (HOSPITAL_COMMUNITY): Payer: Self-pay

## 2021-01-27 DIAGNOSIS — S6010XA Contusion of unspecified finger with damage to nail, initial encounter: Secondary | ICD-10-CM

## 2021-01-27 DIAGNOSIS — S62639A Displaced fracture of distal phalanx of unspecified finger, initial encounter for closed fracture: Secondary | ICD-10-CM

## 2021-01-27 DIAGNOSIS — S6991XA Unspecified injury of right wrist, hand and finger(s), initial encounter: Secondary | ICD-10-CM | POA: Diagnosis present

## 2021-01-27 DIAGNOSIS — F17210 Nicotine dependence, cigarettes, uncomplicated: Secondary | ICD-10-CM | POA: Insufficient documentation

## 2021-01-27 DIAGNOSIS — S60141A Contusion of right ring finger with damage to nail, initial encounter: Secondary | ICD-10-CM | POA: Diagnosis not present

## 2021-01-27 DIAGNOSIS — S62634A Displaced fracture of distal phalanx of right ring finger, initial encounter for closed fracture: Secondary | ICD-10-CM | POA: Insufficient documentation

## 2021-01-27 DIAGNOSIS — W230XXA Caught, crushed, jammed, or pinched between moving objects, initial encounter: Secondary | ICD-10-CM | POA: Insufficient documentation

## 2021-01-27 MED ORDER — ACETAMINOPHEN 325 MG PO TABS
650.0000 mg | ORAL_TABLET | Freq: Once | ORAL | Status: AC
  Administered 2021-01-27: 650 mg via ORAL
  Filled 2021-01-27: qty 2

## 2021-01-27 NOTE — ED Triage Notes (Addendum)
Pt reports he is here today due to right ring finger. Pt reports yesterday evening he smashed his finger in a door and now it is swollen and hurting.

## 2021-01-27 NOTE — ED Notes (Signed)
Ortho tech at bedside 
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
## 2021-01-27 NOTE — Progress Notes (Signed)
Orthopedic Tech Progress Note Patient Details:  Jim Myers 15-Apr-2001 [id number]  Ortho Devices Type of Ortho Device: Finger splint Ortho Device/Splint Location: RUE Ortho Device/Splint Interventions: Ordered,Application,Adjustment   Post Interventions Patient Tolerated: Well Instructions Provided: Care of device,Poper ambulation with device   [name] 01/27/2021, 10:25 AM

## 2021-07-14 IMAGING — CR DG HAND COMPLETE 3+V*R*
3 series · 3 of 3 positions shown · non-contrast
Comparison: X-ray right hand 09/14/14

CLINICAL DATA: slammed hand in door yesterday, pain + swelling
right hand

EXAM:
RIGHT HAND - COMPLETE 3+ VIEW

[hand pa]
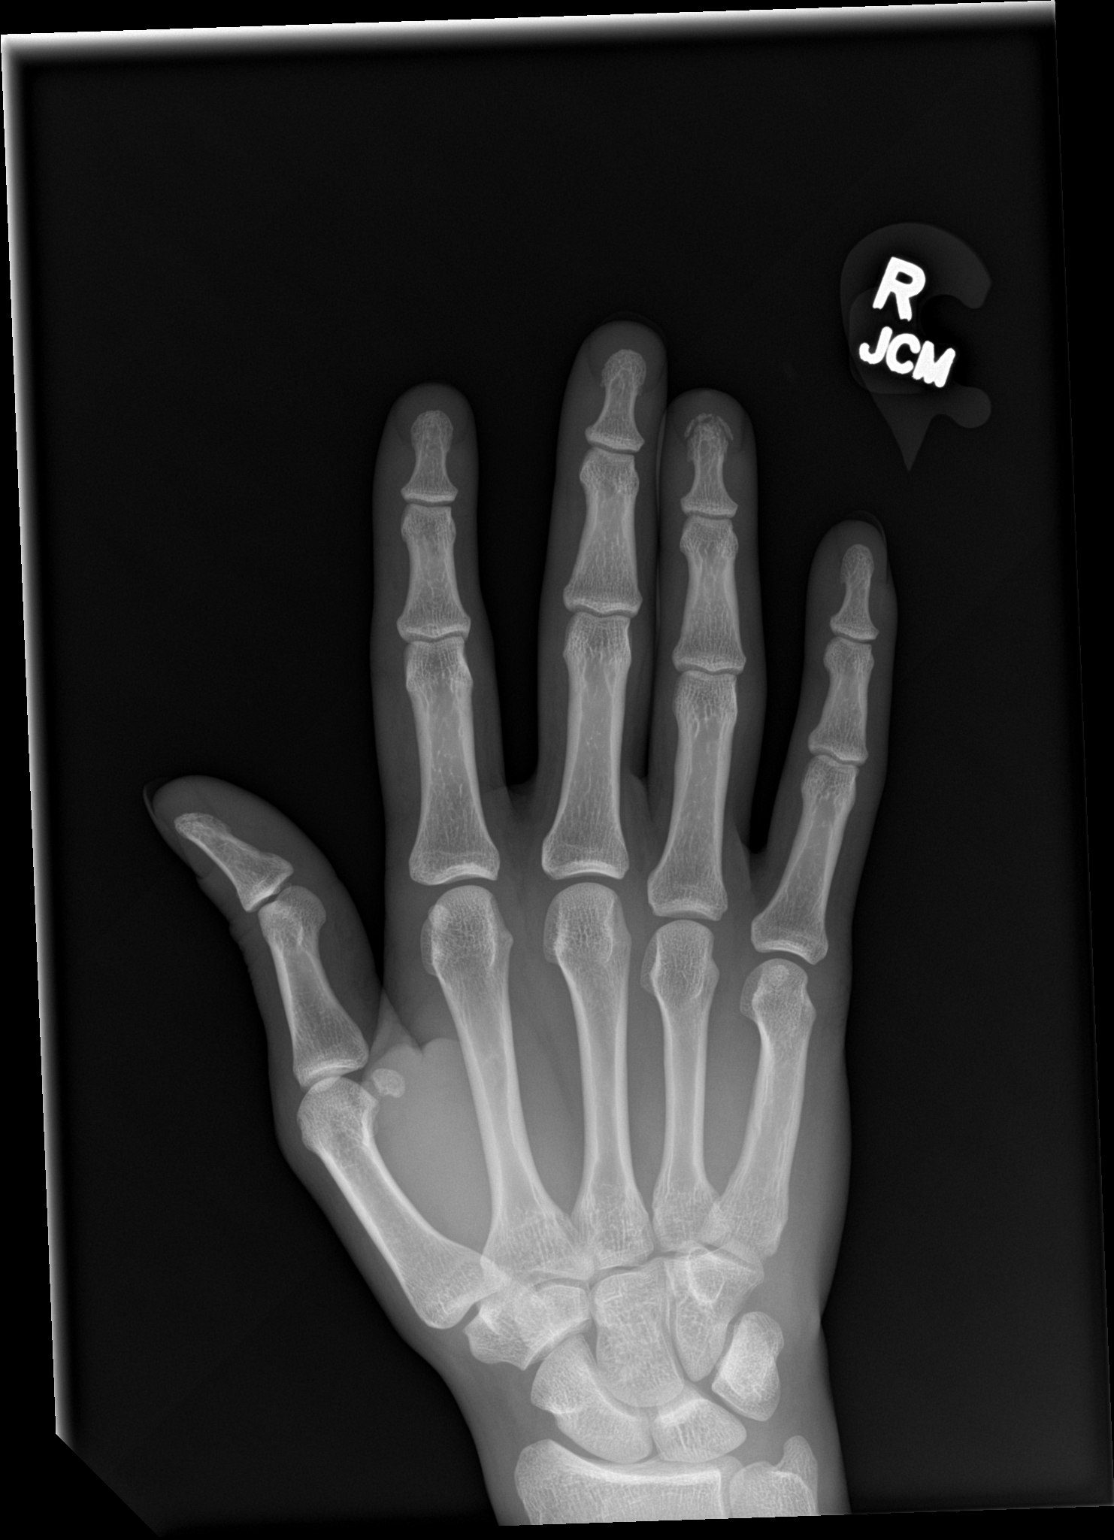

[hand obl]
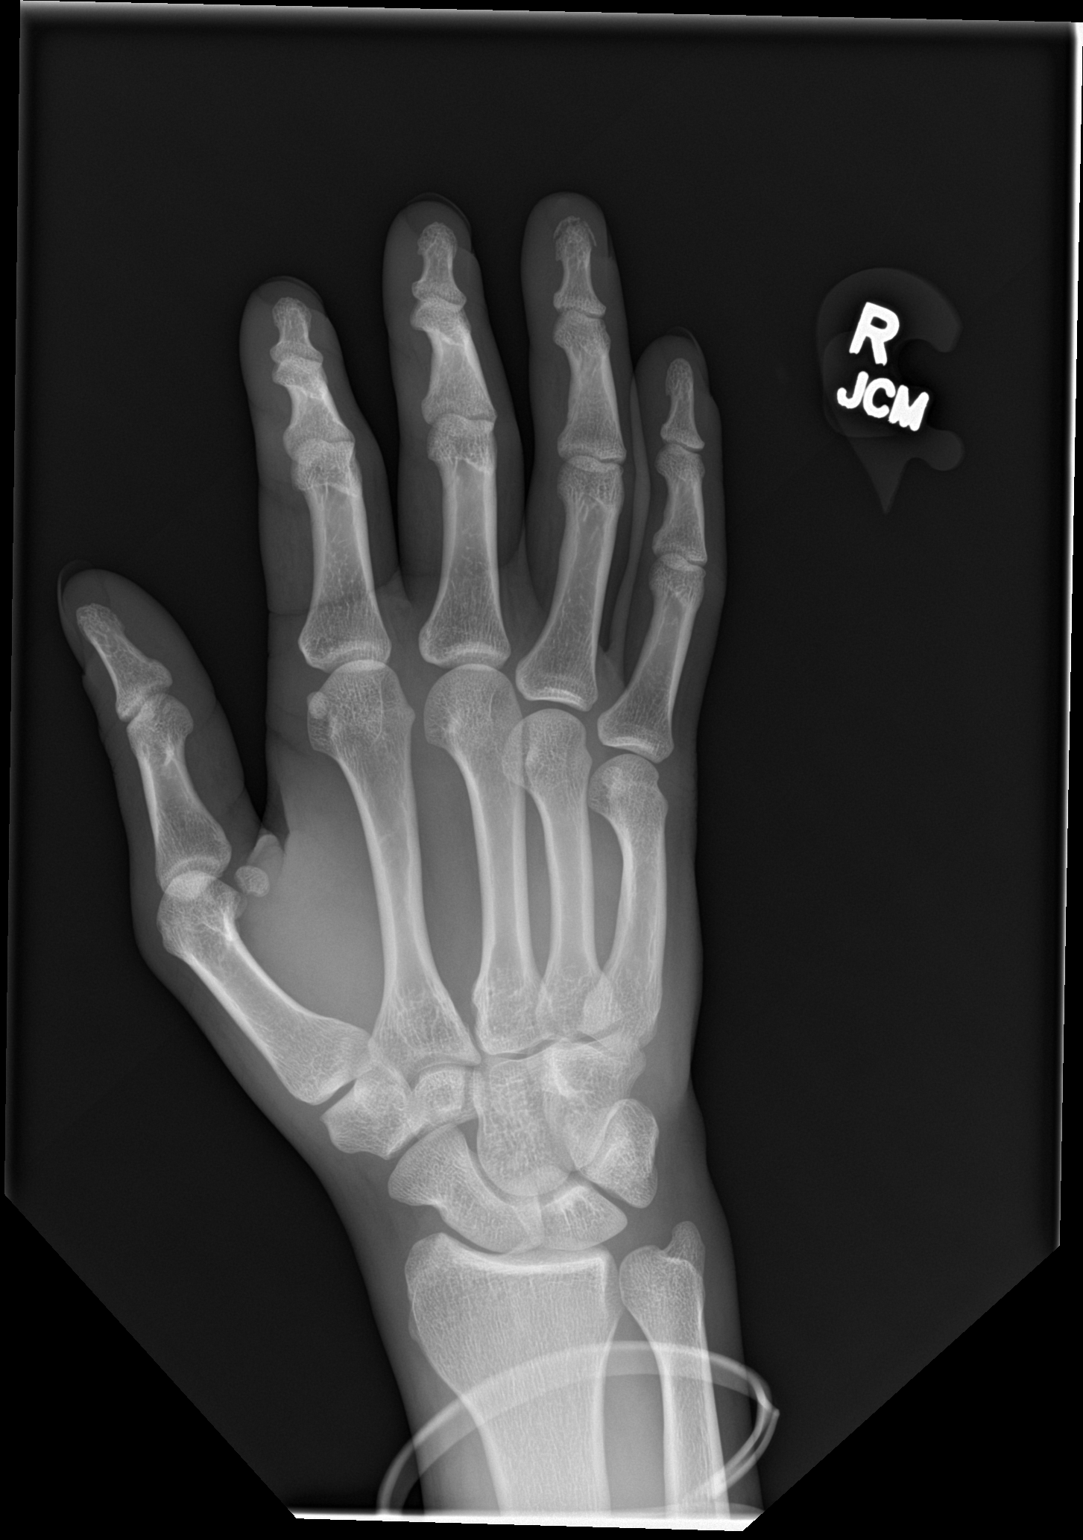

[hand lat]
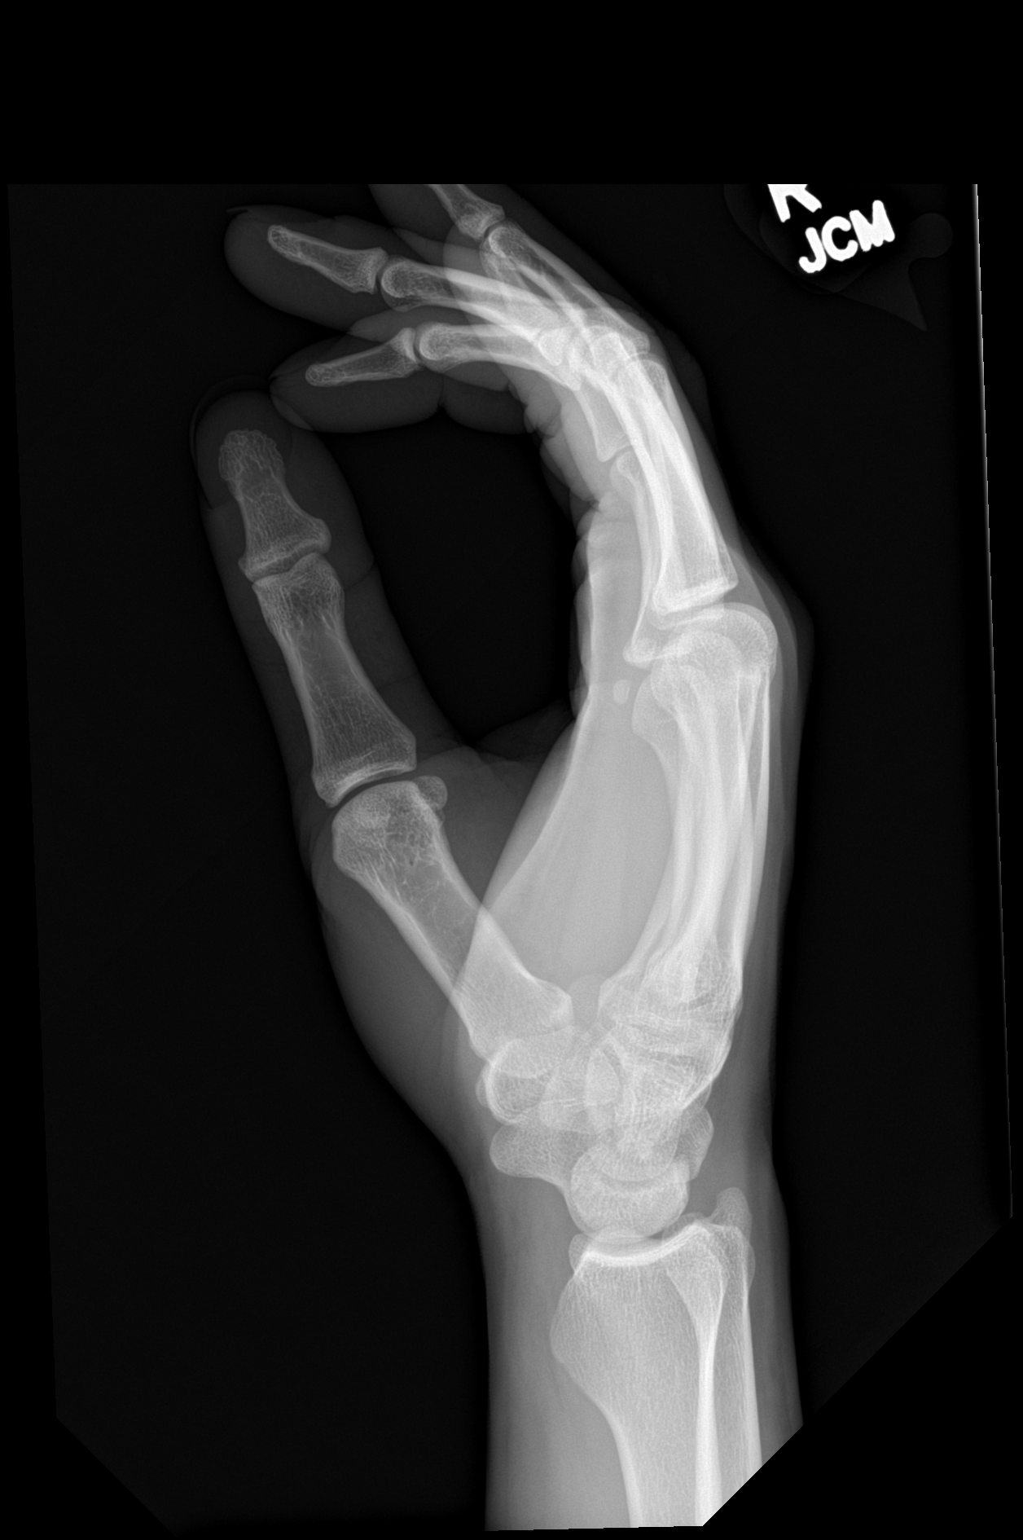

[3 of 3 positions shown; findings below may reference images not displayed]

FINDINGS: Comminuted minimally displaced fracture of the fourth distal phalanx
tuft. There is no evidence of arthropathy or other focal bone
abnormality. Associated subcutaneus soft tissue edema. No retained
radiopaque foreign body.
IMPRESSION: Comminuted minimally displaced fracture of the fourth distal phalanx
tuft.

## 2021-08-15 ENCOUNTER — Ambulatory Visit (HOSPITAL_COMMUNITY)
Admission: EM | Admit: 2021-08-15 | Discharge: 2021-08-15 | Disposition: A | Discharge status: Home / Self Care | Payer: 59 | Attending: Student | Admitting: Student

## 2021-08-15 ENCOUNTER — Other Ambulatory Visit: Payer: Self-pay

## 2021-08-15 ENCOUNTER — Encounter (HOSPITAL_COMMUNITY): Payer: Self-pay | Admitting: Emergency Medicine

## 2021-08-15 DIAGNOSIS — Z202 Contact with and (suspected) exposure to infections with a predominantly sexual mode of transmission: Secondary | ICD-10-CM

## 2021-08-15 DIAGNOSIS — Z113 Encounter for screening for infections with a predominantly sexual mode of transmission: Secondary | ICD-10-CM | POA: Diagnosis not present

## 2021-08-15 MED ORDER — METRONIDAZOLE 500 MG PO TABS
500.0000 mg | ORAL_TABLET | Freq: Two times a day (BID) | ORAL | 0 refills | Status: AC
Start: 2021-08-15 — End: ?

## 2021-08-15 NOTE — ED Triage Notes (Signed)
Patient denies symptoms.  Partner has been exposed to [employer]

## 2021-08-15 NOTE — Discharge Instructions (Addendum)
-  For trichomonas, start the antibiotic-Flagyl (metronidazole), 2 pills daily for 7 days.  You can take this with food if you have a sensitive stomach.  Avoid alcohol while taking this medication and for 2 days after as this will cause severe nausea and vomiting. -We're also screening for gonorrhea and chlamydia. We'll call in about 2 days if this is positive.  -Abstain from intercourse until you complete treatment.

## 2021-08-15 NOTE — ED Provider Notes (Signed)
[facility]    CSN: [id number] Arrival date & time: 08/15/21  0831      History   Chief Complaint Chief Complaint  Patient presents with   Exposure to STD    HPI Hadyn Blanck is a 20 y.o. male presenting with STI screen- exposure to trichomonas (male partner).  Medical history noncontributory. Denies absolutely any symptoms. Denies hematuria, dysuria, frequency, urgency, back pain, n/v/d/abd pain, fevers/chills, abdnormal penile discharge, penile rashes/lesions, penile/testicular swelling/pain.   HPI  Past Medical History:  Diagnosis Date   Attention deficit disorder     There are no problems to display for this patient.   Past Surgical History:  Procedure Laterality Date   FOREIGN BODY REMOVAL Left 06/12/2020   Procedure: REMOVAL OF FOREIGN BODY LEFT THIGH;  Surgeon: Diamantina Monks, MD;  Location: [facility];  Service: General;  Laterality: Left;       Home Medications    Prior to Admission medications   Medication Sig Start Date End Date Taking? Authorizing Provider  metroNIDAZOLE (FLAGYL) 500 MG tablet Take 1 tablet (500 mg total) by mouth 2 (two) times daily. 08/15/21  Yes Rhys Martini, PA-C  ibuprofen (ADVIL) 800 MG tablet Take 1 tablet (800 mg total) by mouth every 8 (eight) hours. 06/12/20   Diamantina Monks, MD  methocarbamol (ROBAXIN) 500 MG tablet Take 1 tablet (500 mg total) by mouth 4 (four) times daily. 06/12/20   Diamantina Monks, MD    Family History Family History  Problem Relation Age of Onset   Healthy Mother    Healthy Father     Social History Social History   Tobacco Use   Smoking status: Every Day    Packs/day: 1.00    Types: Cigarettes   Smokeless tobacco: Never  Vaping Use   Vaping Use: Never used  Substance Use Topics   Alcohol use: Yes    Comment: occasionally   Drug use: Yes    Types: Marijuana    Comment: last time 06-04-20     Allergies   Patient has no known allergies.   Review of  Systems Review of Systems  Constitutional:  Negative for chills and fever.  HENT:  Negative for sore throat.   Eyes:  Negative for pain and redness.  Respiratory:  Negative for shortness of breath.   Cardiovascular:  Negative for chest pain.  Gastrointestinal:  Negative for abdominal pain, diarrhea, nausea and vomiting.  Genitourinary:  Negative for decreased urine volume, difficulty urinating, dysuria, flank pain, frequency, genital sores, hematuria, penile discharge, penile pain, penile swelling, scrotal swelling, testicular pain and urgency.  Musculoskeletal:  Negative for back pain.  Skin:  Negative for rash.  All other systems reviewed and are negative.   Physical Exam Triage Vital Signs ED Triage Vitals  Enc Vitals Group     BP      Pulse      Resp      Temp      Temp src      SpO2      Weight      Height      Head Circumference      Peak Flow      Pain Score      Pain Loc      Pain Edu?      Excl. in GC?    No data found.  Updated Vital Signs BP 133/74 (BP Location: Right Arm)   Pulse 79   Temp 98 F (  36.7 C) (Oral)   Resp 18   SpO2 99%   Visual Acuity Right Eye Distance:   Left Eye Distance:   Bilateral Distance:    Right Eye Near:   Left Eye Near:    Bilateral Near:     Physical Exam Vitals reviewed.  Constitutional:      General: He is not in acute distress.    Appearance: Normal appearance. He is not ill-appearing.  HENT:     Head: Normocephalic and atraumatic.     Mouth/Throat:     Mouth: Mucous membranes are moist.     Comments: Moist mucous membranes Eyes:     Extraocular Movements: Extraocular movements intact.     Pupils: Pupils are equal, round, and reactive to light.  Cardiovascular:     Rate and Rhythm: Normal rate and regular rhythm.     Heart sounds: Normal heart sounds.  Pulmonary:     Effort: Pulmonary effort is normal.     Breath sounds: Normal breath sounds. No wheezing, rhonchi or rales.  Abdominal:     General: Bowel  sounds are normal. There is no distension.     Palpations: Abdomen is soft. There is no mass.     Tenderness: There is no abdominal tenderness. There is no right CVA tenderness, left CVA tenderness, guarding or rebound.  Genitourinary:    Comments: deferred Skin:    General: Skin is warm.     Capillary Refill: Capillary refill takes less than 2 seconds.     Comments: Good skin turgor  Neurological:     General: No focal deficit present.     Mental Status: He is alert and oriented to person, place, and time.  Psychiatric:        Mood and Affect: Mood normal.        Behavior: Behavior normal.     UC Treatments / Results  Labs (all labs ordered are listed, but only abnormal results are displayed) Labs Reviewed  CYTOLOGY, (ORAL, ANAL, URETHRAL) ANCILLARY ONLY    EKG   Radiology No results found.  Procedures Procedures (including critical care time)  Medications Ordered in UC Medications - No data to display  Initial Impression / Assessment and Plan / UC Course  I have reviewed the triage vital signs and the nursing notes.  Pertinent labs & imaging results that were available during my care of the patient were reviewed by me and considered in my medical decision making (see chart for details).     This patient is a very pleasant 20 y.o. year old male presenting with exposure to trichomonas. He is asymptomatic. Afebrile, nontachycardic, no reproducible abd pain or CVAT.  Will send self-swab for G/C, trich. Declines HIV, RPR. Will treat trichomonas today with flagyl. Safe sex precautions.   ED return precautions discussed. Patient verbalizes understanding and agreement.   .   Final Clinical Impressions(s) / UC Diagnoses   Final diagnoses:  Trichomonas exposure  Routine screening for STI (sexually transmitted infection)     Discharge Instructions      -For trichomonas, start the antibiotic-Flagyl (metronidazole), 2 pills daily for 7 days.  You can take this with  food if you have a sensitive stomach.  Avoid alcohol while taking this medication and for 2 days after as this will cause severe nausea and vomiting. -We're also screening for gonorrhea and chlamydia. We'll call in about 2 days if this is positive.  -Abstain from intercourse until you complete treatment.     ED Prescriptions  Medication Sig Dispense Auth. Provider   metroNIDAZOLE (FLAGYL) 500 MG tablet Take 1 tablet (500 mg total) by mouth 2 (two) times daily. 14 tablet Rhys Martini, PA-C      PDMP not reviewed this encounter.   Rhys Martini, PA-C 08/15/21 1005
